# Patient Record
Sex: Male | Born: 2000 | Race: Black or African American | Hispanic: No | Marital: Single | Smoking: Never smoker
Health system: Southern US, Community
[De-identification: ages and names within clinical notes are randomized; demographics above are authoritative.]

## PROBLEM LIST (undated history)

## (undated) DIAGNOSIS — J45909 Unspecified asthma, uncomplicated: Secondary | ICD-10-CM

---
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
## 2023-01-10 DIAGNOSIS — Z419 Encounter for procedure for purposes other than remedying health state, unspecified: Secondary | ICD-10-CM | POA: Diagnosis not present

## 2023-02-10 DIAGNOSIS — Z419 Encounter for procedure for purposes other than remedying health state, unspecified: Secondary | ICD-10-CM | POA: Diagnosis not present

## 2023-03-12 DIAGNOSIS — Z419 Encounter for procedure for purposes other than remedying health state, unspecified: Secondary | ICD-10-CM | POA: Diagnosis not present

## 2023-04-12 DIAGNOSIS — Z419 Encounter for procedure for purposes other than remedying health state, unspecified: Secondary | ICD-10-CM | POA: Diagnosis not present

## 2023-05-12 DIAGNOSIS — Z419 Encounter for procedure for purposes other than remedying health state, unspecified: Secondary | ICD-10-CM | POA: Diagnosis not present

## 2023-06-12 DIAGNOSIS — Z419 Encounter for procedure for purposes other than remedying health state, unspecified: Secondary | ICD-10-CM | POA: Diagnosis not present

## 2023-07-13 DIAGNOSIS — Z419 Encounter for procedure for purposes other than remedying health state, unspecified: Secondary | ICD-10-CM | POA: Diagnosis not present

## 2023-08-10 DIAGNOSIS — Z419 Encounter for procedure for purposes other than remedying health state, unspecified: Secondary | ICD-10-CM | POA: Diagnosis not present

## 2023-09-21 DIAGNOSIS — Z419 Encounter for procedure for purposes other than remedying health state, unspecified: Secondary | ICD-10-CM | POA: Diagnosis not present

## 2023-10-21 DIAGNOSIS — Z419 Encounter for procedure for purposes other than remedying health state, unspecified: Secondary | ICD-10-CM | POA: Diagnosis not present

## 2023-11-21 DIAGNOSIS — Z419 Encounter for procedure for purposes other than remedying health state, unspecified: Secondary | ICD-10-CM | POA: Diagnosis not present

## 2023-12-21 DIAGNOSIS — Z419 Encounter for procedure for purposes other than remedying health state, unspecified: Secondary | ICD-10-CM | POA: Diagnosis not present

## 2024-01-21 DIAGNOSIS — Z419 Encounter for procedure for purposes other than remedying health state, unspecified: Secondary | ICD-10-CM | POA: Diagnosis not present

## 2024-06-25 ENCOUNTER — Other Ambulatory Visit: Payer: Self-pay

## 2024-06-25 ENCOUNTER — Emergency Department
Admission: EM | Admit: 2024-06-25 | Discharge: 2024-06-25 | Disposition: A | Discharge status: Home / Self Care | Payer: Self-pay

## 2024-06-25 DIAGNOSIS — Z4802 Encounter for removal of sutures: Secondary | ICD-10-CM | POA: Insufficient documentation

## 2024-06-25 DIAGNOSIS — S01511D Laceration without foreign body of lip, subsequent encounter: Secondary | ICD-10-CM | POA: Insufficient documentation

## 2024-06-25 DIAGNOSIS — W2105XD Struck by basketball, subsequent encounter: Secondary | ICD-10-CM | POA: Insufficient documentation

## 2024-06-25 NOTE — Discharge Instructions (Signed)
 You can now begin using Vaseline, Aquaphor or other Chapstick to help with wound healing. Watch for signs of infection including redness, warmth, swelling, pain and pus drainage.  If you develop any of these please return to the ED, urgent care or your primary care provider.

## 2024-06-25 NOTE — ED Triage Notes (Signed)
 Pt to ED for stitches removal to upper lip. Denies other complaints.

## 2024-06-25 NOTE — ED Provider Notes (Signed)
 "  [facility] Provider Note    None    (approximate)   History   Suture / Staple Removal   HPI  Dakota Graham is a 24 y.o. male with no PMH who presents for suture removal. Patient had sutures placed at a different facility last Thursday. He was playing basketball and got hit in the mouth on the right side, sustaining a laceration. Patient had sutures placed on the inside of his lip but states that he doesn't feel them there anymore. No drainage from the area, redness or swelling.      Physical Exam   Triage Vital Signs: ED Triage Vitals  Encounter Vitals Group     BP 06/25/24 1208 126/78     Girls Systolic BP Percentile --      Girls Diastolic BP Percentile --      Boys Systolic BP Percentile --      Boys Diastolic BP Percentile --      Pulse Rate 06/25/24 1208 (!) 58     Resp 06/25/24 1208 16     Temp 06/25/24 1208 97.8 F (36.6 C)     Temp src --      SpO2 06/25/24 1208 100 %     Weight 06/25/24 1207 180 lb (81.6 kg)     Height 06/25/24 1207 6' 2 (1.88 m)     Head Circumference --      Peak Flow --      Pain Score 06/25/24 1207 0     Pain Loc --      Pain Education --      Exclude from Growth Chart --     Most recent vital signs: Vitals:   06/25/24 1208  BP: 126/78  Pulse: (!) 58  Resp: 16  Temp: 97.8 F (36.6 C)  SpO2: 100%   General: Awake, no distress.  CV:  Good peripheral perfusion. Resp:  Normal effort.  Abd:  No distention.  Other:  Well healing laceration to the right side of the upper lip with extension to the interior lip which is also healing well, no gaping when patient opens his mouth   ED Results / Procedures / Treatments   Labs (all labs ordered are listed, but only abnormal results are displayed) Labs Reviewed - No data to display   PROCEDURES:  Critical Care performed: No  Procedures   MEDICATIONS ORDERED IN ED: Medications - No data to display   IMPRESSION / MDM / ASSESSMENT AND PLAN / ED  COURSE  I reviewed the triage vital signs and the nursing notes.                             24 year old male presents for suture removal.  Vital signs stable, patient NAD.  Differential diagnosis includes, but is not limited to, suture removal, wound evaluation, wound infection.  Patient's presentation is most consistent with acute, uncomplicated illness.  Laceration is healing well.  Sutures removed without difficulty.  No signs of infection.  Discussed further wound care with patient.  Patient voiced understanding, all questions were answered he was stable at discharge.      FINAL CLINICAL IMPRESSION(S) / ED DIAGNOSES   Final diagnoses:  Visit for suture removal     Rx / DC Orders   ED Discharge Orders     None        Note:  This document was prepared using Dragon voice recognition software and  may include unintentional dictation errors.   Cleaster Tinnie LABOR, PA-C 06/25/24 1303    Rexford Reche HERO, MD 06/25/24 1540  "
